# Patient Record
Sex: Male | Born: 1965 | Race: White | Marital: Single | Smoking: Never smoker
Health system: Southern US, Community
[De-identification: ages and names within clinical notes are randomized; demographics above are authoritative.]

## PROBLEM LIST (undated history)

## (undated) DIAGNOSIS — E079 Disorder of thyroid, unspecified: Secondary | ICD-10-CM

---

## 2015-04-20 ENCOUNTER — Emergency Department
Admission: EM | Admit: 2015-04-20 | Discharge: 2015-04-20 | Disposition: A | Discharge status: Home / Self Care | Attending: Emergency Medicine | Admitting: Emergency Medicine

## 2015-04-20 ENCOUNTER — Emergency Department

## 2015-04-20 ENCOUNTER — Encounter: Payer: Self-pay | Admitting: Emergency Medicine

## 2015-04-20 DIAGNOSIS — M545 Low back pain: Secondary | ICD-10-CM | POA: Diagnosis present

## 2015-04-20 DIAGNOSIS — M5440 Lumbago with sciatica, unspecified side: Secondary | ICD-10-CM | POA: Insufficient documentation

## 2015-04-20 DIAGNOSIS — E079 Disorder of thyroid, unspecified: Secondary | ICD-10-CM | POA: Insufficient documentation

## 2015-04-20 DIAGNOSIS — M5441 Lumbago with sciatica, right side: Secondary | ICD-10-CM

## 2015-04-20 DIAGNOSIS — M5442 Lumbago with sciatica, left side: Secondary | ICD-10-CM

## 2015-04-20 HISTORY — DX: Disorder of thyroid, unspecified: E07.9

## 2015-04-20 MED ORDER — BACLOFEN 10 MG PO TABS: 10.0000 mg | ORAL_TABLET | Freq: Three times a day (TID) | ORAL | Status: AC | PRN

## 2015-04-20 MED ORDER — PREDNISONE 10 MG PO TABS: ORAL_TABLET | ORAL | Status: AC

## 2015-04-20 MED ORDER — METHYLPREDNISOLONE SODIUM SUCC 125 MG IJ SOLR
80.0000 mg | Freq: Once | INTRAMUSCULAR | Status: AC
  Administered 2015-04-20: 80 mg via INTRAMUSCULAR
  Filled 2015-04-20: qty 2
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
## 2015-04-20 NOTE — Discharge Instructions (Signed)
Back Exercises °The following exercises strengthen the muscles that help to support the back. They also help to keep the lower back flexible. Doing these exercises can help to prevent back pain or lessen existing pain. °If you have back pain or discomfort, try doing these exercises 2-3 times each day or as told by your health care provider. When the pain goes away, do them once each day, but increase the number of times that you repeat the steps for each exercise (do more repetitions). If you do not have back pain or discomfort, do these exercises once each day or as told by your health care provider. °EXERCISES °Single Knee to Chest °Repeat these steps 3-5 times for each leg: °· Lie on your back on a firm bed or the floor with your legs extended. °· Bring one knee to your chest. Your other leg should stay extended and in contact with the floor. °· Hold your knee in place by grabbing your knee or thigh. °· Pull on your knee until you feel a gentle stretch in your lower back. °· Hold the stretch for 10-30 seconds. °· Slowly release and straighten your leg. °Pelvic Tilt °Repeat these steps 5-10 times: °· Lie on your back on a firm bed or the floor with your legs extended. °· Bend your knees so they are pointing toward the ceiling and your feet are flat on the floor. °· Tighten your lower abdominal muscles to press your lower back against the floor. This motion will tilt your pelvis so your tailbone points up toward the ceiling instead of pointing to your feet or the floor. °· With gentle tension and even breathing, hold this position for 5-10 seconds. °Cat-Cow °Repeat these steps until your lower back becomes more flexible: °· Get into a hands-and-knees position on a firm surface. Keep your hands under your shoulders, and keep your knees under your hips. You may place padding under your knees for comfort. °· Let your head hang down, and point your tailbone toward the floor so your lower back becomes rounded like the  back of a cat. °· Hold this position for 5 seconds. °· Slowly lift your head and point your tailbone up toward the ceiling so your back forms a sagging arch like the back of a cow. °· Hold this position for 5 seconds. °Press-Ups °Repeat these steps 5-10 times: °· Lie on your abdomen (face-down) on the floor. °· Place your palms near your head, about shoulder-width apart. °· While you keep your back as relaxed as possible and keep your hips on the floor, slowly straighten your arms to raise the top half of your body and lift your shoulders. Do not use your back muscles to raise your upper torso. You may adjust the placement of your hands to make yourself more comfortable. °· Hold this position for 5 seconds while you keep your back relaxed. °· Slowly return to lying flat on the floor. °Bridges °Repeat these steps 10 times: °· Lie on your back on a firm surface. °· Bend your knees so they are pointing toward the ceiling and your feet are flat on the floor. °· Tighten your buttocks muscles and lift your buttocks off of the floor until your waist is at almost the same height as your knees. You should feel the muscles working in your buttocks and the back of your thighs. If you do not feel these muscles, slide your feet 1-2 inches farther away from your buttocks. °· Hold this position for 3-5   seconds.  Slowly lower your hips to the starting position, and allow your buttocks muscles to relax completely. If this exercise is too easy, try doing it with your arms crossed over your chest. Abdominal Crunches Repeat these steps 5-10 times:  Lie on your back on a firm bed or the floor with your legs extended.  Bend your knees so they are pointing toward the ceiling and your feet are flat on the floor.  Cross your arms over your chest.  Tip your chin slightly toward your chest without bending your neck.  Tighten your abdominal muscles and slowly raise your trunk (torso) high enough to lift your shoulder blades a  tiny bit off of the floor. Avoid raising your torso higher than that, because it can put too much stress on your low back and it does not help to strengthen your abdominal muscles.  Slowly return to your starting position. Back Lifts Repeat these steps 5-10 times:  Lie on your abdomen (face-down) with your arms at your sides, and rest your forehead on the floor.  Tighten the muscles in your legs and your buttocks.  Slowly lift your chest off of the floor while you keep your hips pressed to the floor. Keep the back of your head in line with the curve in your back. Your eyes should be looking at the floor.  Hold this position for 3-5 seconds.  Slowly return to your starting position. SEEK MEDICAL CARE IF:  Your back pain or discomfort gets much worse when you do an exercise.  Your back pain or discomfort does not lessen within 2 hours after you exercise. If you have any of these problems, stop doing these exercises right away. Do not do them again unless your health care provider says that you can. SEEK IMMEDIATE MEDICAL CARE IF:  You develop sudden, severe back pain. If this happens, stop doing the exercises right away. Do not do them again unless your health care provider says that you can.   This information is not intended to replace advice given to you by your health care provider. Make sure you discuss any questions you have with your health care provider.   Document Released: 02/02/2004 Document Revised: 09/15/2014 Document Reviewed: 02/18/2014 [employer] Interactive Patient Education 2016 [employer]  Sciatica With Rehab The sciatic nerve runs from the back down the leg and is responsible for sensation and control of the muscles in the back (posterior) side of the thigh, lower leg, and foot. Sciatica is a condition that is characterized by inflammation of this nerve.  SYMPTOMS   Signs of nerve damage, including numbness and/or weakness along the posterior side of the lower  extremity.  Pain in the back of the thigh that may also travel down the leg.  Pain that worsens when sitting for long periods of time.  Occasionally, pain in the back or buttock. CAUSES  Inflammation of the sciatic nerve is the cause of sciatica. The inflammation is due to something irritating the nerve. Common sources of irritation include:  Sitting for long periods of time.  Direct trauma to the nerve.  Arthritis of the spine.  Herniated or ruptured disk.  Slipping of the vertebrae (spondylolisthesis).  Pressure from soft tissues, such as muscles or ligament-like tissue (fascia). RISK INCREASES WITH:  Sports that place pressure or stress on the spine (football or weightlifting).  Poor strength and flexibility.  Failure to warm up properly before activity.  Family history of low back pain or disk disorders.  Previous back injury or surgery.  Poor body mechanics, especially when lifting, or poor posture. PREVENTION   Warm up and stretch properly before activity.  Maintain physical fitness:  Strength, flexibility, and endurance.  Cardiovascular fitness.  Learn and use proper technique, especially with posture and lifting. When possible, have coach correct improper technique.  Avoid activities that place stress on the spine. PROGNOSIS If treated properly, then sciatica usually resolves within 6 weeks. However, occasionally surgery is necessary.  RELATED COMPLICATIONS   Permanent nerve damage, including pain, numbness, tingle, or weakness.  Chronic back pain.  Risks of surgery: infection, bleeding, nerve damage, or damage to surrounding tissues. TREATMENT Treatment initially involves resting from any activities that aggravate your symptoms. The use of ice and medication may help reduce pain and inflammation. The use of strengthening and stretching exercises may help reduce pain with activity. These exercises may be performed at home or with referral to a  therapist. A therapist may recommend further treatments, such as transcutaneous electronic nerve stimulation (TENS) or ultrasound. Your caregiver may recommend corticosteroid injections to help reduce inflammation of the sciatic nerve. If symptoms persist despite non-surgical (conservative) treatment, then surgery may be recommended. MEDICATION  If pain medication is necessary, then nonsteroidal anti-inflammatory medications, such as aspirin and ibuprofen, or other minor pain relievers, such as acetaminophen, are often recommended.  Do not take pain medication for 7 days before surgery.  Prescription pain relievers may be given if deemed necessary by your caregiver. Use only as directed and only as much as you need.  Ointments applied to the skin may be helpful.  Corticosteroid injections may be given by your caregiver. These injections should be reserved for the most serious cases, because they may only be given a certain number of times. HEAT AND COLD  Cold treatment (icing) relieves pain and reduces inflammation. Cold treatment should be applied for 10 to 15 minutes every 2 to 3 hours for inflammation and pain and immediately after any activity that aggravates your symptoms. Use ice packs or massage the area with a piece of ice (ice massage).  Heat treatment may be used prior to performing the stretching and strengthening activities prescribed by your caregiver, physical therapist, or athletic trainer. Use a heat pack or soak the injury in warm water. SEEK MEDICAL CARE IF:  Treatment seems to offer no benefit, or the condition worsens.  Any medications produce adverse side effects. EXERCISES  RANGE OF MOTION (ROM) AND STRETCHING EXERCISES - Sciatica Most people with sciatic will find that their symptoms worsen with either excessive bending forward (flexion) or arching at the low back (extension). The exercises which will help resolve your symptoms will focus on the opposite motion. Your  physician, physical therapist or athletic trainer will help you determine which exercises will be most helpful to resolve your low back pain. Do not complete any exercises without first consulting with your clinician. Discontinue any exercises which worsen your symptoms until you speak to your clinician. If you have pain, numbness or tingling which travels down into your buttocks, leg or foot, the goal of the therapy is for these symptoms to move closer to your back and eventually resolve. Occasionally, these leg symptoms will get better, but your low back pain may worsen; this is typically an indication of progress in your rehabilitation. Be certain to be very alert to any changes in your symptoms and the activities in which you participated in the 24 hours prior to the change. Sharing this information with your  clinician will allow him/her to most efficiently treat your condition. These exercises may help you when beginning to rehabilitate your injury. Your symptoms may resolve with or without further involvement from your physician, physical therapist or athletic trainer. While completing these exercises, remember:   Restoring tissue flexibility helps normal motion to return to the joints. This allows healthier, less painful movement and activity.  An effective stretch should be held for at least 30 seconds.  A stretch should never be painful. You should only feel a gentle lengthening or release in the stretched tissue. FLEXION RANGE OF MOTION AND STRETCHING EXERCISES: STRETCH - Flexion, Single Knee to Chest   Lie on a firm bed or floor with both legs extended in front of you.  Keeping one leg in contact with the floor, bring your opposite knee to your chest. Hold your leg in place by either grabbing behind your thigh or at your knee.  Pull until you feel a gentle stretch in your low back. Hold __________ seconds.  Slowly release your grasp and repeat the exercise with the opposite side. Repeat  __________ times. Complete this exercise __________ times per day.  STRETCH - Flexion, Double Knee to Chest  Lie on a firm bed or floor with both legs extended in front of you.  Keeping one leg in contact with the floor, bring your opposite knee to your chest.  Tense your stomach muscles to support your back and then lift your other knee to your chest. Hold your legs in place by either grabbing behind your thighs or at your knees.  Pull both knees toward your chest until you feel a gentle stretch in your low back. Hold __________ seconds.  Tense your stomach muscles and slowly return one leg at a time to the floor. Repeat __________ times. Complete this exercise __________ times per day.  STRETCH - Low Trunk Rotation   Lie on a firm bed or floor. Keeping your legs in front of you, bend your knees so they are both pointed toward the ceiling and your feet are flat on the floor.  Extend your arms out to the side. This will stabilize your upper body by keeping your shoulders in contact with the floor.  Gently and slowly drop both knees together to one side until you feel a gentle stretch in your low back. Hold for __________ seconds.  Tense your stomach muscles to support your low back as you bring your knees back to the starting position. Repeat the exercise to the other side. Repeat __________ times. Complete this exercise __________ times per day  EXTENSION RANGE OF MOTION AND FLEXIBILITY EXERCISES: STRETCH - Extension, Prone on Elbows  Lie on your stomach on the floor, a bed will be too soft. Place your palms about shoulder width apart and at the height of your head.  Place your elbows under your shoulders. If this is too painful, stack pillows under your chest.  Allow your body to relax so that your hips drop lower and make contact more completely with the floor.  Hold this position for __________ seconds.  Slowly return to lying flat on the floor. Repeat __________ times.  Complete this exercise __________ times per day.  RANGE OF MOTION - Extension, Prone Press Ups  Lie on your stomach on the floor, a bed will be too soft. Place your palms about shoulder width apart and at the height of your head.  Keeping your back as relaxed as possible, slowly straighten your elbows while keeping your  hips on the floor. You may adjust the placement of your hands to maximize your comfort. As you gain motion, your hands will come more underneath your shoulders.  Hold this position __________ seconds.  Slowly return to lying flat on the floor. Repeat __________ times. Complete this exercise __________ times per day.  STRENGTHENING EXERCISES - Sciatica  These exercises may help you when beginning to rehabilitate your injury. These exercises should be done near your "sweet spot." This is the neutral, low-back arch, somewhere between fully rounded and fully arched, that is your least painful position. When performed in this safe range of motion, these exercises can be used for people who have either a flexion or extension based injury. These exercises may resolve your symptoms with or without further involvement from your physician, physical therapist or athletic trainer. While completing these exercises, remember:   Muscles can gain both the endurance and the strength needed for everyday activities through controlled exercises.  Complete these exercises as instructed by your physician, physical therapist or athletic trainer. Progress with the resistance and repetition exercises only as your caregiver advises.  You may experience muscle soreness or fatigue, but the pain or discomfort you are trying to eliminate should never worsen during these exercises. If this pain does worsen, stop and make certain you are following the directions exactly. If the pain is still present after adjustments, discontinue the exercise until you can discuss the trouble with your clinician. STRENGTHENING -  Deep Abdominals, Pelvic Tilt   Lie on a firm bed or floor. Keeping your legs in front of you, bend your knees so they are both pointed toward the ceiling and your feet are flat on the floor.  Tense your lower abdominal muscles to press your low back into the floor. This motion will rotate your pelvis so that your tail bone is scooping upwards rather than pointing at your feet or into the floor.  With a gentle tension and even breathing, hold this position for __________ seconds. Repeat __________ times. Complete this exercise __________ times per day.  STRENGTHENING - Abdominals, Crunches   Lie on a firm bed or floor. Keeping your legs in front of you, bend your knees so they are both pointed toward the ceiling and your feet are flat on the floor. Cross your arms over your chest.  Slightly tip your chin down without bending your neck.  Tense your abdominals and slowly lift your trunk high enough to just clear your shoulder blades. Lifting higher can put excessive stress on the low back and does not further strengthen your abdominal muscles.  Control your return to the starting position. Repeat __________ times. Complete this exercise __________ times per day.  STRENGTHENING - Quadruped, Opposite UE/LE Lift  Assume a hands and knees position on a firm surface. Keep your hands under your shoulders and your knees under your hips. You may place padding under your knees for comfort.  Find your neutral spine and gently tense your abdominal muscles so that you can maintain this position. Your shoulders and hips should form a rectangle that is parallel with the floor and is not twisted.  Keeping your trunk steady, lift your right hand no higher than your shoulder and then your left leg no higher than your hip. Make sure you are not holding your breath. Hold this position __________ seconds.  Continuing to keep your abdominal muscles tense and your back steady, slowly return to your starting  position. Repeat with the opposite arm and leg.  Repeat __________ times. Complete this exercise __________ times per day.  STRENGTHENING - Abdominals and Quadriceps, Straight Leg Raise   Lie on a firm bed or floor with both legs extended in front of you.  Keeping one leg in contact with the floor, bend the other knee so that your foot can rest flat on the floor.  Find your neutral spine, and tense your abdominal muscles to maintain your spinal position throughout the exercise.  Slowly lift your straight leg off the floor about 6 inches for a count of 15, making sure to not hold your breath.  Still keeping your neutral spine, slowly lower your leg all the way to the floor. Repeat this exercise with each leg __________ times. Complete this exercise __________ times per day. POSTURE AND BODY MECHANICS CONSIDERATIONS - Sciatica Keeping correct posture when sitting, standing or completing your activities will reduce the stress put on different body tissues, allowing injured tissues a chance to heal and limiting painful experiences. The following are general guidelines for improved posture. Your physician or physical therapist will provide you with any instructions specific to your needs. While reading these guidelines, remember:  The exercises prescribed by your provider will help you have the flexibility and strength to maintain correct postures.  The correct posture provides the optimal environment for your joints to work. All of your joints have less wear and tear when properly supported by a spine with good posture. This means you will experience a healthier, less painful body.  Correct posture must be practiced with all of your activities, especially prolonged sitting and standing. Correct posture is as important when doing repetitive low-stress activities (typing) as it is when doing a single heavy-load activity (lifting). RESTING POSITIONS Consider which positions are most painful for you  when choosing a resting position. If you have pain with flexion-based activities (sitting, bending, stooping, squatting), choose a position that allows you to rest in a less flexed posture. You would want to avoid curling into a fetal position on your side. If your pain worsens with extension-based activities (prolonged standing, working overhead), avoid resting in an extended position such as sleeping on your stomach. Most people will find more comfort when they rest with their spine in a more neutral position, neither too rounded nor too arched. Lying on a non-sagging bed on your side with a pillow between your knees, or on your back with a pillow under your knees will often provide some relief. Keep in mind, being in any one position for a prolonged period of time, no matter how correct your posture, can still lead to stiffness. PROPER SITTING POSTURE In order to minimize stress and discomfort on your spine, you must sit with correct posture Sitting with good posture should be effortless for a healthy body. Returning to good posture is a gradual process. Many people can work toward this most comfortably by using various supports until they have the flexibility and strength to maintain this posture on their own. When sitting with proper posture, your ears will fall over your shoulders and your shoulders will fall over your hips. You should use the back of the chair to support your upper back. Your low back will be in a neutral position, just slightly arched. You may place a small pillow or folded towel at the base of your low back for support.  When working at a desk, create an environment that supports good, upright posture. Without extra support, muscles fatigue and lead to excessive strain on joints  and other tissues. Keep these recommendations in mind: CHAIR:   A chair should be able to slide under your desk when your back makes contact with the back of the chair. This allows you to work closely.  The  chair's height should allow your eyes to be level with the upper part of your monitor and your hands to be slightly lower than your elbows. BODY POSITION  Your feet should make contact with the floor. If this is not possible, use a foot rest.  Keep your ears over your shoulders. This will reduce stress on your neck and low back. INCORRECT SITTING POSTURES   If you are feeling tired and unable to assume a healthy sitting posture, do not slouch or slump. This puts excessive strain on your back tissues, causing more damage and pain. Healthier options include:  Using more support, like a lumbar pillow.  Switching tasks to something that requires you to be upright or walking.  Talking a brief walk.  Lying down to rest in a neutral-spine position. PROLONGED STANDING WHILE SLIGHTLY LEANING FORWARD  When completing a task that requires you to lean forward while standing in one place for a long time, place either foot up on a stationary 2-4 inch high object to help maintain the best posture. When both feet are on the ground, the low back tends to lose its slight inward curve. If this curve flattens (or becomes too large), then the back and your other joints will experience too much stress, fatigue more quickly and can cause pain.  CORRECT STANDING POSTURES Proper standing posture should be assumed with all daily activities, even if they only take a few moments, like when brushing your teeth. As in sitting, your ears should fall over your shoulders and your shoulders should fall over your hips. You should keep a slight tension in your abdominal muscles to brace your spine. Your tailbone should point down to the ground, not behind your body, resulting in an over-extended swayback posture.  INCORRECT STANDING POSTURES  Common incorrect standing postures include a forward head, locked knees and/or an excessive swayback. WALKING Walk with an upright posture. Your ears, shoulders and hips should all  line-up. PROLONGED ACTIVITY IN A FLEXED POSITION When completing a task that requires you to bend forward at your waist or lean over a low surface, try to find a way to stabilize 3 of 4 of your limbs. You can place a hand or elbow on your thigh or rest a knee on the surface you are reaching across. This will provide you more stability so that your muscles do not fatigue as quickly. By keeping your knees relaxed, or slightly bent, you will also reduce stress across your low back. CORRECT LIFTING TECHNIQUES DO :   Assume a wide stance. This will provide you more stability and the opportunity to get as close as possible to the object which you are lifting.  Tense your abdominals to brace your spine; then bend at the knees and hips. Keeping your back locked in a neutral-spine position, lift using your leg muscles. Lift with your legs, keeping your back straight.  Test the weight of unknown objects before attempting to lift them.  Try to keep your elbows locked down at your sides in order get the best strength from your shoulders when carrying an object.  Always ask for help when lifting heavy or awkward objects. INCORRECT LIFTING TECHNIQUES DO NOT:   Lock your knees when lifting, even if it is a  small object.  Bend and twist. Pivot at your feet or move your feet when needing to change directions.  Assume that you cannot safely pick up a paperclip without proper posture.   This information is not intended to replace advice given to you by your health care provider. Make sure you discuss any questions you have with your health care provider.   Document Released: 12/25/2004 Document Revised: 05/11/2014 Document Reviewed: 04/08/2008 [employer] Interactive Patient Education [employer]
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
